# Patient Record
Sex: Female | Born: 1965 | Race: White | Hispanic: No | Marital: Married | Smoking: Never smoker
Health system: Southern US, Community
[De-identification: ages and names within clinical notes are randomized; demographics above are authoritative.]

## PROBLEM LIST (undated history)

## (undated) DIAGNOSIS — J45909 Unspecified asthma, uncomplicated: Secondary | ICD-10-CM

## (undated) HISTORY — PX: APPENDECTOMY: SHX54

---

## 2012-06-01 ENCOUNTER — Emergency Department
Admission: EM | Admit: 2012-06-01 | Discharge: 2012-06-01 | Disposition: A | Discharge status: Home / Self Care | Payer: BC Managed Care – PPO | Source: Home / Self Care

## 2012-06-01 ENCOUNTER — Encounter: Payer: Self-pay | Admitting: Emergency Medicine

## 2012-06-01 MED ORDER — INFLUENZA VAC TYP A&B SURF ANT IM INJ: 0.5000 mL | INJECTION | INTRAMUSCULAR | Status: DC

## 2012-06-01 NOTE — ED Notes (Signed)
Flu shot

## 2012-06-01 NOTE — ED Notes (Signed)
Patient's son tested positive for Flu A, so Flu shot was discontinued for now, will come back in a couple of weeks.

## 2012-07-31 ENCOUNTER — Encounter: Payer: Self-pay | Admitting: *Deleted

## 2012-07-31 ENCOUNTER — Emergency Department
Admission: EM | Admit: 2012-07-31 | Discharge: 2012-07-31 | Disposition: A | Discharge status: Home / Self Care | Payer: BC Managed Care – PPO | Source: Home / Self Care | Attending: Family Medicine | Admitting: Family Medicine

## 2012-07-31 DIAGNOSIS — B309 Viral conjunctivitis, unspecified: Secondary | ICD-10-CM

## 2012-07-31 MED ORDER — SULFACETAMIDE SODIUM 10 % OP SOLN: 1.0000 [drp] | OPHTHALMIC | Status: DC

## 2012-07-31 NOTE — ED Notes (Signed)
Pt c/o RT eye soreness, watering and redness x 1 day. Denies injury.

## 2012-07-31 NOTE — ED Provider Notes (Signed)
History     CSN: [id number]  Arrival date & time 07/31/12  1737   First MD Initiated Contact with Patient 07/31/12 1749      Chief Complaint  Patient presents with  . Eye Drainage  . Eye Problem       HPI Comments: Patient noticed mild redness in her right eye last night but no foreign body sensation.  The redness was somewhat worse this morning upon awakening.  She has had increased lacrimation in her right eye but no discharge.  No known foreign body.  No symptoms in left eye.  She notes mild nasal congestion but no more than usual.  She has had an occasional mild cough for a week.  She feels well otherwise.  No sore throat.  Patient is a 47 y.o. female presenting with conjunctivitis. The history is provided by the patient.  Conjunctivitis  The current episode started yesterday. The onset was sudden. The problem occurs continuously. The problem has been unchanged. The problem is mild. Nothing relieves the symptoms. Nothing aggravates the symptoms. Associated symptoms include congestion, eye pain and eye redness. Pertinent negatives include no fever, no decreased vision, no double vision, no eye itching, no photophobia, no ear discharge, no ear pain, no headaches, no hearing loss, no mouth sores, no rhinorrhea, no sore throat, no swollen glands, no neck pain, no neck stiffness, no cough, no URI and no eye discharge. The eye pain is mild. The right eye is affected.The eye pain is not associated with movement.    History reviewed. No pertinent past medical history.  Past Surgical History  Procedure Laterality Date  . Appendectomy      Family History  Problem Relation Age of Onset  . Adopted: Yes    History  Substance Use Topics  . Smoking status: Never Smoker   . Smokeless tobacco: Not on file  . Alcohol Use: No    OB History   Grav Para Term Preterm Abortions TAB SAB Ect Mult Living                  Review of Systems  Constitutional: Negative for fever.  HENT:  Positive for congestion. Negative for hearing loss, ear pain, sore throat, rhinorrhea, mouth sores, neck pain and ear discharge.   Eyes: Positive for pain and redness. Negative for double vision, photophobia, discharge and itching.  Respiratory: Negative for cough.   Neurological: Negative for headaches.  All other systems reviewed and are negative.    Allergies  Review of patient's allergies indicates no known allergies.  Home Medications   Current Outpatient Rx  Name  Route  Sig  Dispense  Refill  . sulfacetamide (BLEPH-10) 10 % ophthalmic solution   Right Eye   Place 1-2 drops into the right eye every 3 (three) hours. Use while awake   5 mL   0     BP 137/83  Pulse 87  Temp(Src) 97.7 F (36.5 C) (Oral)  Ht 5\' 10"  (1.778 m)  Wt 268 lb (121.564 kg)  BMI 38.45 kg/m2  SpO2 97%  Physical Exam Nursing notes and Vital Signs reviewed. Appearance:  Patient appears stated age, and in no acute distress.  Patient is obese (BMI 38.5) Eyes:  Pupils are equal, round, and reactive to light and accomodation.  Extraocular movement is intact.  Right conjunctivae minimally injected.  No discharge.  No lid swelling or tenderness.  Fundi normal.  Right lid eversion reveals no foreign body.  Fluorescein reveals no uptake. Ears:  Canals normal.  Tympanic membranes normal.  Nose:  Mildly congested turbinates, worse on right.  No sinus tenderness.   Pharynx:  Normal Neck:  Supple.  No adenopathy Skin:  No rash present.   ED Course  Procedures  none      1. Viral conjunctivitis; suspect early viral URI       MDM   Recommend using refrigerated lubricating drops (such as Refresh tears).  Begin antibiotic eye drops if not improving in about two days (given Rx for sulfacetamide 10% ophth soln).  Followup with ophthalmologist if not improved about 4 days.        Lattie Haw, MD 07/31/12 [phone]

## 2013-01-16 ENCOUNTER — Emergency Department (INDEPENDENT_AMBULATORY_CARE_PROVIDER_SITE_OTHER): Payer: BC Managed Care – PPO

## 2013-01-16 ENCOUNTER — Encounter: Payer: Self-pay | Admitting: *Deleted

## 2013-01-16 ENCOUNTER — Emergency Department
Admission: EM | Admit: 2013-01-16 | Discharge: 2013-01-16 | Disposition: A | Discharge status: Home / Self Care | Payer: BC Managed Care – PPO | Source: Home / Self Care | Attending: Family Medicine | Admitting: Family Medicine

## 2013-01-16 DIAGNOSIS — M25569 Pain in unspecified knee: Secondary | ICD-10-CM

## 2013-01-16 DIAGNOSIS — M545 Low back pain, unspecified: Secondary | ICD-10-CM

## 2013-01-16 DIAGNOSIS — IMO0002 Reserved for concepts with insufficient information to code with codable children: Secondary | ICD-10-CM

## 2013-01-16 DIAGNOSIS — M2560 Stiffness of unspecified joint, not elsewhere classified: Secondary | ICD-10-CM

## 2013-01-16 DIAGNOSIS — M25559 Pain in unspecified hip: Secondary | ICD-10-CM

## 2013-01-16 DIAGNOSIS — M25551 Pain in right hip: Secondary | ICD-10-CM

## 2013-01-16 DIAGNOSIS — S93409A Sprain of unspecified ligament of unspecified ankle, initial encounter: Secondary | ICD-10-CM

## 2013-01-16 DIAGNOSIS — S8391XA Sprain of unspecified site of right knee, initial encounter: Secondary | ICD-10-CM

## 2013-01-16 DIAGNOSIS — M25579 Pain in unspecified ankle and joints of unspecified foot: Secondary | ICD-10-CM

## 2013-01-16 MED ORDER — MELOXICAM 15 MG PO TABS: 15.0000 mg | ORAL_TABLET | Freq: Every day | ORAL | Status: DC

## 2013-01-16 MED ORDER — CYCLOBENZAPRINE HCL 10 MG PO TABS: 10.0000 mg | ORAL_TABLET | Freq: Three times a day (TID) | ORAL | Status: DC | PRN

## 2013-01-16 MED ORDER — KETOROLAC TROMETHAMINE 30 MG/ML IJ SOLN
30.0000 mg | Freq: Once | INTRAMUSCULAR | Status: AC
  Administered 2013-01-16: 30 mg via INTRAMUSCULAR
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
## 2013-01-26 ENCOUNTER — Telehealth: Payer: Self-pay | Admitting: *Deleted

## 2013-01-30 ENCOUNTER — Other Ambulatory Visit: Payer: Self-pay | Admitting: Family Medicine

## 2013-03-14 ENCOUNTER — Other Ambulatory Visit: Payer: Self-pay | Admitting: Family Medicine

## 2013-03-15 ENCOUNTER — Encounter: Payer: Self-pay | Admitting: Emergency Medicine

## 2013-03-15 ENCOUNTER — Emergency Department
Admission: EM | Admit: 2013-03-15 | Discharge: 2013-03-15 | Disposition: A | Discharge status: Home / Self Care | Payer: BC Managed Care – PPO | Source: Home / Self Care | Attending: Emergency Medicine | Admitting: Emergency Medicine

## 2013-03-15 DIAGNOSIS — J209 Acute bronchitis, unspecified: Secondary | ICD-10-CM

## 2013-03-15 MED ORDER — PROMETHAZINE-CODEINE 6.25-10 MG/5ML PO SYRP: ORAL_SOLUTION | ORAL | Status: DC

## 2013-03-15 MED ORDER — AZITHROMYCIN 250 MG PO TABS: ORAL_TABLET | ORAL | Status: DC

## 2013-03-15 NOTE — ED Provider Notes (Addendum)
CSN: [id number]     Arrival date & time 03/15/13  4098 History   First MD Initiated Contact with Patient 03/15/13 [phone]     Chief Complaint  Patient presents with  . Cough   (Consider location/radiation/quality/duration/timing/severity/associated sxs/prior Treatment) The history is provided by the patient.  Dry cough, body aches, congestion, runny nose x 6 days URI HISTORY  Regina Rivas is a 47 y.o. female who complains of onset of cold symptoms for 6 days.  Have been using over-the-counter treatment which helps a little bit.  No chills/sweats +  Low-grade Fever  +  Nasal congestion +  Minimal Discolored Post-nasal drainage Mild sinus pain/pressure No sore throat  +  Cough, severe, hacking at times, occasionally inducing vomiting after a coughing fit. No blood in vomit Occasional scant discolored sputum. No wheezing Positive chest congestion No hemoptysis No shortness of breath No pleuritic pain  No itchy/red eyes No earache  No nausea No vomiting No abdominal pain No diarrhea  No skin rashes +  Fatigue No myalgias Minimal headache   No history of chronic lung disease. Nonsmoker. No recent travel. had her flu shot last month  Denies chance of pregnancy  History reviewed. No pertinent past medical history. Past Surgical History  Procedure Laterality Date  . Appendectomy     Family History  Problem Relation Age of Onset  . Adopted: Yes   History  Substance Use Topics  . Smoking status: Never Smoker   . Smokeless tobacco: Not on file  . Alcohol Use: No   OB History   Grav Para Term Preterm Abortions TAB SAB Ect Mult Living                 Review of Systems  All other systems reviewed and are negative.    Allergies  Review of patient's allergies indicates not on file.  Home Medications   Current Outpatient Rx  Name  Route  Sig  Dispense  Refill  . DM-Doxylamine-Acetaminophen (NYQUIL COLD & FLU PO)   Oral   Take by mouth.         .  Pseudoephedrine-APAP-DM (DAYQUIL MULTI-SYMPTOM COLD/FLU PO)   Oral   Take by mouth.         Marland Kitchen azithromycin (ZITHROMAX Z-PAK) 250 MG tablet      Take 2 tablets on day one, then 1 tablet daily on days 2 through 5   1 each   0   . promethazine-codeine (PHENERGAN WITH CODEINE) 6.25-10 MG/5ML syrup      Take 1-2 teaspoons every 4-6 hours as needed for cough. May cause drowsiness.   120 mL   0    BP 156/91  Pulse 94  Temp(Src) 97.7 F (36.5 C) (Oral)  Ht 5\' 9"  (1.753 m)  Wt 261 lb (118.389 kg)  BMI 38.53 kg/m2  SpO2 98% Physical Exam  Nursing note and vitals reviewed. Constitutional: She is oriented to person, place, and time. She appears well-developed and well-nourished. No distress.  HENT:  Head: Normocephalic and atraumatic.  Right Ear: Tympanic membrane normal.  Left Ear: Tympanic membrane normal.  Nose: Nose normal.  Mouth/Throat: Oropharynx is clear and moist. No oropharyngeal exudate.  Eyes: Right eye exhibits no discharge. Left eye exhibits no discharge. No scleral icterus.  Neck: Neck supple.  Cardiovascular: Normal rate, regular rhythm and normal heart sounds.   Pulmonary/Chest: No respiratory distress. She has no wheezes. She has rhonchi. She has no rales.  Lymphadenopathy:    She has no cervical adenopathy.  Neurological: She is alert and oriented to person, place, and time.  Skin: Skin is warm and dry.    ED Course  Procedures (including critical care time) Labs Review Labs Reviewed - No data to display Imaging Review No results found.  EKG Interpretation     Ventricular Rate:    PR Interval:    QRS Duration:   QT Interval:    QTC Calculation:   R Axis:     Text Interpretation:              MDM   1. Acute bronchitis    Treatment options discussed. Zithromax Z-Pak. Promethazine with codeine cough syrup, 4 ounces. No refills. Instructions and precautions discussed. Other OTC symptomatic care discussed. Precautions discussed. Red  flags discussed. Questions invited and answered. Patient voiced understanding and agreement.    Lajean Manes, MD 03/15/13 1610  Lajean Manes, MD 03/15/13 [phone]

## 2013-03-15 NOTE — ED Notes (Signed)
Dry cough, body aches, congestion, runny nose x 6 days

## 2013-05-28 ENCOUNTER — Other Ambulatory Visit: Payer: Self-pay | Admitting: Family Medicine

## 2014-07-06 ENCOUNTER — Encounter: Payer: Self-pay | Admitting: Emergency Medicine

## 2014-07-06 ENCOUNTER — Emergency Department (INDEPENDENT_AMBULATORY_CARE_PROVIDER_SITE_OTHER)
Admission: EM | Admit: 2014-07-06 | Discharge: 2014-07-06 | Disposition: A | Discharge status: Home / Self Care | Payer: BLUE CROSS/BLUE SHIELD | Source: Home / Self Care | Attending: Family Medicine | Admitting: Family Medicine

## 2014-07-06 DIAGNOSIS — M722 Plantar fascial fibromatosis: Secondary | ICD-10-CM

## 2014-07-06 HISTORY — DX: Unspecified asthma, uncomplicated: J45.909

## 2014-07-06 NOTE — Discharge Instructions (Signed)
Thank you for coming in today. A deep ice for 10-15 minutes several times a day Do the deep ice massage if possible remember to pull your big toe back.  do the heel lift exercises. Remember to lower your self slowly Follow up with Dr. Karie Schwalbe   Plantar Fasciitis (Heel Spur Syndrome) with Rehab The plantar fascia is a fibrous, ligament-like, soft-tissue structure that spans the bottom of the foot. Plantar fasciitis is a condition that causes pain in the foot due to inflammation of the tissue. SYMPTOMS   Pain and tenderness on the underneath side of the foot.  Pain that worsens with standing or walking. CAUSES  Plantar fasciitis is caused by irritation and injury to the plantar fascia on the underneath side of the foot. Common mechanisms of injury include:  Direct trauma to bottom of the foot.  Damage to a small nerve that runs under the foot where the main fascia attaches to the heel bone.  Stress placed on the plantar fascia due to bone spurs. RISK INCREASES WITH:   Activities that place stress on the plantar fascia (running, jumping, pivoting, or cutting).  Poor strength and flexibility.  Improperly fitted shoes.  Tight calf muscles.  Flat feet.  Failure to warm-up properly before activity.  Obesity. PREVENTION  Warm up and stretch properly before activity.  Allow for adequate recovery between workouts.  Maintain physical fitness:  Strength, flexibility, and endurance.  Cardiovascular fitness.  Maintain a health body weight.  Avoid stress on the plantar fascia.  Wear properly fitted shoes, including arch supports for individuals who have flat feet. PROGNOSIS  If treated properly, then the symptoms of plantar fasciitis usually resolve without surgery. However, occasionally surgery is necessary. RELATED COMPLICATIONS   Recurrent symptoms that may result in a chronic condition.  Problems of the lower back that are caused by compensating for the injury, such as  limping.  Pain or weakness of the foot during push-off following surgery.  Chronic inflammation, scarring, and partial or complete fascia tear, occurring more often from repeated injections. TREATMENT  Treatment initially involves the use of ice and medication to help reduce pain and inflammation. The use of strengthening and stretching exercises may help reduce pain with activity, especially stretches of the Achilles tendon. These exercises may be performed at home or with a therapist. Your caregiver may recommend that you use heel cups of arch supports to help reduce stress on the plantar fascia. Occasionally, corticosteroid injections are given to reduce inflammation. If symptoms persist for greater than 6 months despite non-surgical (conservative), then surgery may be recommended.  MEDICATION   If pain medication is necessary, then nonsteroidal anti-inflammatory medications, such as aspirin and ibuprofen, or other minor pain relievers, such as acetaminophen, are often recommended.  Do not take pain medication within 7 days before surgery.  Prescription pain relievers may be given if deemed necessary by your caregiver. Use only as directed and only as much as you need.  Corticosteroid injections may be given by your caregiver. These injections should be reserved for the most serious cases, because they may only be given a certain number of times. HEAT AND COLD  Cold treatment (icing) relieves pain and reduces inflammation. Cold treatment should be applied for 10 to 15 minutes every 2 to 3 hours for inflammation and pain and immediately after any activity that aggravates your symptoms. Use ice packs or massage the area with a piece of ice (ice massage).  Heat treatment may be used prior to performing the  stretching and strengthening activities prescribed by your caregiver, physical therapist, or athletic trainer. Use a heat pack or soak the injury in warm water. SEEK IMMEDIATE MEDICAL CARE  IF:  Treatment seems to offer no benefit, or the condition worsens.  Any medications produce adverse side effects. EXERCISES RANGE OF MOTION (ROM) AND STRETCHING EXERCISES - Plantar Fasciitis (Heel Spur Syndrome) These exercises may help you when beginning to rehabilitate your injury. Your symptoms may resolve with or without further involvement from your physician, physical therapist or athletic trainer. While completing these exercises, remember:   Restoring tissue flexibility helps normal motion to return to the joints. This allows healthier, less painful movement and activity.  An effective stretch should be held for at least 30 seconds.  A stretch should never be painful. You should only feel a gentle lengthening or release in the stretched tissue. RANGE OF MOTION - Toe Extension, Flexion  Sit with your right / left leg crossed over your opposite knee.  Grasp your toes and gently pull them back toward the top of your foot. You should feel a stretch on the bottom of your toes and/or foot.  Hold this stretch for __________ seconds.  Now, gently pull your toes toward the bottom of your foot. You should feel a stretch on the top of your toes and or foot.  Hold this stretch for __________ seconds. Repeat __________ times. Complete this stretch __________ times per day.  RANGE OF MOTION - Ankle Dorsiflexion, Active Assisted  Remove shoes and sit on a chair that is preferably not on a carpeted surface.  Place right / left foot under knee. Extend your opposite leg for support.  Keeping your heel down, slide your right / left foot back toward the chair until you feel a stretch at your ankle or calf. If you do not feel a stretch, slide your bottom forward to the edge of the chair, while still keeping your heel down.  Hold this stretch for __________ seconds. Repeat __________ times. Complete this stretch __________ times per day.  STRETCH - Gastroc, Standing  Place hands on  wall.  Extend right / left leg, keeping the front knee somewhat bent.  Slightly point your toes inward on your back foot.  Keeping your right / left heel on the floor and your knee straight, shift your weight toward the wall, not allowing your back to arch.  You should feel a gentle stretch in the right / left calf. Hold this position for __________ seconds. Repeat __________ times. Complete this stretch __________ times per day. STRETCH - Soleus, Standing  Place hands on wall.  Extend right / left leg, keeping the other knee somewhat bent.  Slightly point your toes inward on your back foot.  Keep your right / left heel on the floor, bend your back knee, and slightly shift your weight over the back leg so that you feel a gentle stretch deep in your back calf.  Hold this position for __________ seconds. Repeat __________ times. Complete this stretch __________ times per day. STRETCH - Gastrocsoleus, Standing  Note: This exercise can place a lot of stress on your foot and ankle. Please complete this exercise only if specifically instructed by your caregiver.   Place the ball of your right / left foot on a step, keeping your other foot firmly on the same step.  Hold on to the wall or a rail for balance.  Slowly lift your other foot, allowing your body weight to press your heel down  over the edge of the step.  You should feel a stretch in your right / left calf.  Hold this position for __________ seconds.  Repeat this exercise with a slight bend in your right / left knee. Repeat __________ times. Complete this stretch __________ times per day.  STRENGTHENING EXERCISES - Plantar Fasciitis (Heel Spur Syndrome)  These exercises may help you when beginning to rehabilitate your injury. They may resolve your symptoms with or without further involvement from your physician, physical therapist or athletic trainer. While completing these exercises, remember:   Muscles can gain both the  endurance and the strength needed for everyday activities through controlled exercises.  Complete these exercises as instructed by your physician, physical therapist or athletic trainer. Progress the resistance and repetitions only as guided. STRENGTH - Towel Curls  Sit in a chair positioned on a non-carpeted surface.  Place your foot on a towel, keeping your heel on the floor.  Pull the towel toward your heel by only curling your toes. Keep your heel on the floor.  If instructed by your physician, physical therapist or athletic trainer, add ____________________ at the end of the towel. Repeat __________ times. Complete this exercise __________ times per day. STRENGTH - Ankle Inversion  Secure one end of a rubber exercise band/tubing to a fixed object (table, pole). Loop the other end around your foot just before your toes.  Place your fists between your knees. This will focus your strengthening at your ankle.  Slowly, pull your big toe up and in, making sure the band/tubing is positioned to resist the entire motion.  Hold this position for __________ seconds.  Have your muscles resist the band/tubing as it slowly pulls your foot back to the starting position. Repeat __________ times. Complete this exercises __________ times per day.  Document Released: 04/19/2005 Document Revised: 07/12/2011 Document Reviewed: 08/01/2008 [facility] CenterExitCare Patient Information 2015 [employer], [place]. This information is not intended to replace advice given to you by your health care provider. Make sure you discuss any questions you have with your health care provider.

## 2014-07-06 NOTE — ED Notes (Signed)
Patient reports about 6 weeks of heel and arch pain predominantly in right foot, but some in left foot also. Worse in certain positions.

## 2014-07-06 NOTE — ED Provider Notes (Signed)
Regina Rivas is a 49 y.o. female who presents to Urgent Care today for heel pain. Patient has a 6 week history of plantar calcaneus pain. The pain is stabbing and worse with ambulation. She notes pain when she first stands up in the morning. She's tried some over-the-counter foot insoles which seemed to help a bit. She has not had any dedicated exercises. She is concerned that she may have plantar fasciitis or a heel spur. She denies any injury. No radiating pain fevers chills nausea vomiting or diarrhea. She's tried some Aleve which helps. 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   Past Medical History  Diagnosis Date  . Asthma    Past Surgical History  Procedure Laterality Date  . Appendectomy     History  Substance Use Topics  . Smoking status: Never Smoker   . Smokeless tobacco: Not on file  . Alcohol Use: No   ROS as above Medications: No current facility-administered medications for this encounter.   Current Outpatient Prescriptions  Medication Sig Dispense Refill  . meloxicam (MOBIC) 15 MG tablet TAKE 1 TABLET (15 MG TOTAL) BY MOUTH DAILY. 30 tablet 1   No Known Allergies   Exam:  BP 121/82 mmHg  Pulse 100  Temp(Src) 97.5 F (36.4 C) (Oral)  Ht 5\' 9"  (1.753 m)  Wt 280 lb (127.007 kg)  BMI 41.33 kg/m2  SpO2 96% Gen: Well NAD Right foot normal-appearing no significant swelling or obvious deformity. Tender palpation at the plantar medial calcaneus. Pain with first toe dorsiflexion. Normal motion pulses capillary refill and sensation.  No results found for this or any previous visit (from the past 24 hour(s)). No results found.  Assessment and Plan: 49 y.o. female with plantar fasciitis. Plan for heel cups, eccentric calf exercises, ice and massage, and follow up with sports medicine.  Discussed warning signs or symptoms. Please see discharge instructions. Patient expresses understanding.     Rodolph BongEvan S Naryah Clenney, MD 07/06/14 [phone] [id number]

## 2016-08-31 DIAGNOSIS — R6889 Other general symptoms and signs: Secondary | ICD-10-CM | POA: Diagnosis not present

## 2016-08-31 DIAGNOSIS — J029 Acute pharyngitis, unspecified: Secondary | ICD-10-CM | POA: Diagnosis not present

## 2017-09-12 ENCOUNTER — Emergency Department (INDEPENDENT_AMBULATORY_CARE_PROVIDER_SITE_OTHER): Payer: BLUE CROSS/BLUE SHIELD

## 2017-09-12 ENCOUNTER — Other Ambulatory Visit: Payer: Self-pay

## 2017-09-12 ENCOUNTER — Emergency Department
Admission: EM | Admit: 2017-09-12 | Discharge: 2017-09-12 | Disposition: A | Discharge status: Home / Self Care | Payer: BLUE CROSS/BLUE SHIELD | Source: Home / Self Care | Attending: Emergency Medicine | Admitting: Emergency Medicine

## 2017-09-12 DIAGNOSIS — M79642 Pain in left hand: Secondary | ICD-10-CM | POA: Diagnosis not present

## 2017-09-12 DIAGNOSIS — S8262XA Displaced fracture of lateral malleolus of left fibula, initial encounter for closed fracture: Secondary | ICD-10-CM | POA: Diagnosis not present

## 2017-09-12 DIAGNOSIS — S82892A Other fracture of left lower leg, initial encounter for closed fracture: Secondary | ICD-10-CM | POA: Diagnosis not present

## 2017-09-12 DIAGNOSIS — W19XXXA Unspecified fall, initial encounter: Secondary | ICD-10-CM

## 2017-09-12 DIAGNOSIS — S60222A Contusion of left hand, initial encounter: Secondary | ICD-10-CM

## 2017-09-12 MED ORDER — IBUPROFEN 200 MG PO TABS: ORAL_TABLET | ORAL | 0 refills | Status: AC

## 2017-09-12 NOTE — ED Provider Notes (Signed)
[facility]    CSN: [id number] Arrival date & time: 09/12/17  1113     History   Chief Complaint Chief Complaint  Patient presents with  . Ankle Pain    Left  . Hand Pain    Left- Thumb pain also    HPI Regina Rivas is a 52 y.o. female.   HPI Yesterday, accidentally fell.  She states she "rolled my left ankle" heard a pop, and fell landing on the left hand and thumb. Currently, has mild diffuse dull pain left lateral ankle associated with mild numbness and moderate to severe swelling lateral left ankle.  She can weight-bear, but with much pain.  She reports she had fractured that left ankle many years ago. When she fell yesterday, landed on base of left thumb area of left hand and it is persistently moderately swollen and mildly painful.  No numbness.  She feels she cannot grip well with left thumb. Tried Tylenol at 9 AM this morning and that helped her pain a little.  Used ice last night and that helped a little. Past Medical History:  Diagnosis Date  . Asthma   Past medical history of asthma, not having wheezing now.  She has on amoxicillin, prescribed by another physician for URI, and that is helping her URI.  There are no active problems to display for this patient.   Past Surgical History:  Procedure Laterality Date  . APPENDECTOMY      OB History   None      Home Medications    Prior to Admission medications   Medication Sig Start Date End Date Taking? Authorizing Provider  ibuprofen (ADVIL,MOTRIN) 200 MG tablet Take three tablets ( 600 milligrams total) every 8 hrs with food as needed for pain. 09/12/17   Lajean Manes, MD    Family History Family History  Adopted: Yes  Family history unknown, she is adopted.  Social History Social History   Tobacco Use  . Smoking status: Never Smoker  . Smokeless tobacco: Never Used  Substance Use Topics  . Alcohol use: No  . Drug use: No   She has never smoked.  Allergies   Patient has no known  allergies.   Review of Systems Review of Systems  Constitutional: Negative for fever.  HENT:       URI diagnosed and treated elsewhere several days ago, which is improving on amoxicillin prescribed  Respiratory: Positive for cough (Minimal, nonproductive). Negative for shortness of breath.   Cardiovascular: Negative for chest pain.  Gastrointestinal: Negative for abdominal pain, diarrhea, nausea and vomiting.  Genitourinary: Negative.   Musculoskeletal: Positive for joint swelling. Negative for neck pain.       See also HPI  Neurological: Negative for dizziness and light-headedness.  Psychiatric/Behavioral: Negative for confusion.  All other systems reviewed and are negative.    Physical Exam Triage Vital Signs ED Triage Vitals  Enc Vitals Group     BP 09/12/17 1145 134/83     Pulse Rate 09/12/17 1145 90     Resp --      Temp 09/12/17 1145 98.2 F (36.8 C)     Temp Source 09/12/17 1145 Oral     SpO2 09/12/17 1145 96 %     Weight 09/12/17 1156 (!) 321 lb 12.8 oz (146 kg)     Height 09/12/17 1156  (1.753 m)     Head Circumference --      Peak Flow --      Pain Score  09/12/17 1146 0     Pain Loc --      Pain Edu? --      Excl. in GC? --    No data found.  Updated Vital Signs BP 134/83 (BP Location: Right Arm)   Pulse 90   Temp 98.2 F (36.8 C) (Oral)   Ht  (1.753 m)   Wt (!) 321 lb 12.8 oz (146 kg)   SpO2 96%   BMI 47.52 kg/m    Physical Exam  Constitutional: She is oriented to person, place, and time. She appears well-developed and well-nourished. No distress.  HENT:  Head: Normocephalic and atraumatic.  Eyes: Pupils are equal, round, and reactive to light. No scleral icterus.  Neck: Normal range of motion. Neck supple.  Cardiovascular: Normal rate and regular rhythm.  Pulmonary/Chest: Effort normal.  Abdominal: She exhibits no distension.  Musculoskeletal:       Left hand: She exhibits decreased range of motion and tenderness. She exhibits  normal capillary refill. Normal sensation noted. Normal strength noted.  Left ankle: Very swollen, mildly tender, mildly ecchymotic, lateral aspect left ankle over lateral malleolus, also proximal aspect of lateral malleolus and over anterior talofibular ligament.  Pain exacerbated by inversion.  Mildly decreased range of motion.  No definite instability. Sensation, motor, tendons intact.  Capillary refill distally intact and DP pulse intact.  Left hand: Very tender and swollen over base of left thumb.  Decreased range of motion.  No open wound.  No instability. Neurovascular distally intact    Neurological: She is alert and oriented to person, place, and time. No cranial nerve deficit.  Skin: Skin is warm and dry.  Psychiatric: She has a normal mood and affect. Her behavior is normal.  Vitals reviewed.  12:01 PM-x-rays left ankle and left hand ordered  UC Treatments / Results  Labs (all labs ordered are listed, but only abnormal results are displayed) Labs Reviewed - No data to display  EKG None  Radiology Dg Ankle Complete Left  Result Date: 09/12/2017 CLINICAL DATA:  Status post fall.  Twisting injury. EXAM: LEFT ANKLE COMPLETE - 3+ VIEW COMPARISON:  None FINDINGS: Avulsion fracture of distal tip of the lateral malleolus with severe overlying soft tissue swelling. Multiple well corticated bony fragments adjacent to the medial malleolus consistent with prior avulsive injury. No other acute fracture or dislocation. Ankle mortise is intact. IMPRESSION: Avulsion fracture of distal tip of the lateral malleolus with severe overlying soft tissue swelling. Electronically Signed   By: Elige Ko   On: 09/12/2017 12:36   Dg Hand Complete Left  Result Date: 09/12/2017 CLINICAL DATA:  Status post fall.  Left hand pain. EXAM: LEFT HAND - COMPLETE 3+ VIEW COMPARISON:  None. FINDINGS: There is no evidence of fracture or dislocation. There is no evidence of arthropathy or other focal bone  abnormality. Soft tissues are unremarkable. IMPRESSION: No acute osseous injury of the left hand. Electronically Signed   By: Elige Ko   On: 09/12/2017 12:37    Procedures Procedures (including critical care time)  Medications Ordered in UC Medications - No data to display  Initial Impression / Assessment and Plan / UC Course  I have reviewed the triage vital signs and the nursing notes.  Pertinent labs & imaging results that were available during my care of the patient were reviewed by me and considered in my medical decision making (see chart for details).      Final Clinical Impressions(s) / UC Diagnoses   Final  diagnoses:  Closed fracture of left ankle, initial encounter  Contusion of left hand, initial encounter  Reviewed with her that x-ray left hand normal.  X-ray left ankle shows a tiny avulsion fracture at the tip of lateral malleolus. Treatment options discussed, as well as risks, benefits, alternatives. Patient voiced understanding and agreement with the following plans: She declined any splint or wrap for left hand/thumb, but precautions discussed and other options discussed. Encourage rest, ice, compression with ACE bandage, and elevation of injured body part. Ace bandage left ankle.  Cam walker/boot left ankle. An After Visit Summary was printed and given to the patient. See below for details regarding instructions given in AVS. Verbal instructions also given. She declined any prescription pain med, but discussed OTC ibuprofen up to 600 mg 3 times daily with food as needed pain.  She has taken ibuprofen in the past without problems.  Advised not to take ibuprofen with any other NSAIDs.  She denies history of kidney problems or abnormal kidney function tests in the past.-I advised to drink plenty of fluids when she is taking any NSAID. Follow-up with sports medicine or orthopedist in 7 days Precautions discussed. Red flags discussed. Questions invited and  answered. Patient voiced understanding and agreement.   Discharge Instructions     X-ray left hand normal.  You have contusion of left hand.  May use Ace bandage or left hand/wrist splint.  This should heal with time. X-ray left ankle shows a tiny chip fracture over the tip of the lateral malleolus/end of left fibula (see printed photo given). Ice and Ace bandage and elevate for the next 2 days.  "Cam boot" provided.  Try to keep left ankle elevated for much of day, but also important to get up and walk around every couple of hours during the day.  For pain, OTC ibuprofen up to 600 mg every 8 hours with food as needed for pain. (Do not take ibuprofen if history of any kidney problems, and do not take other NSAIDs with ibuprofen) Call and make an appointment to follow-up with Dr. Clementeen Graham, sports medicine specialist, in 1 week. If any severe worsening symptoms in the meantime, follow-up here in urgent care or go to emergency room. See also attached instruction sheets on tibia fracture and hand contusion    ED Prescriptions    Medication Sig Dispense Auth. Provider   ibuprofen (ADVIL,MOTRIN) 200 MG tablet Take three tablets ( 600 milligrams total) every 8 hrs with food as needed for pain. 20 tablet Lajean Manes, MD     Controlled Substance Prescriptions [place] Controlled Substance Registry consulted? Not Applicable   Lajean Manes, MD 09/12/17 1538

## 2017-09-12 NOTE — ED Triage Notes (Signed)
Pt fell at [facility] yesterday and rolled her left ankle. Also has some pain in her L hand/thumb. Hard to grip. Used Ice and Tylenol. Last dose 9am this morning.

## 2017-09-12 NOTE — Discharge Instructions (Signed)
X-ray left hand normal.  You have contusion of left hand.  May use Ace bandage or left hand/wrist splint.  This should heal with time. X-ray left ankle shows a tiny chip fracture over the tip of the lateral malleolus/end of left fibula (see printed photo given). Ice and Ace bandage and elevate for the next 2 days.  "Cam boot" provided.  Try to keep left ankle elevated for much of day, but also important to get up and walk around every couple of hours during the day.  For pain, OTC ibuprofen up to 600 mg every 8 hours with food as needed for pain. (Do not take ibuprofen if history of any kidney problems, and do not take other NSAIDs with ibuprofen) Call and make an appointment to follow-up with Dr. Clementeen Graham, sports medicine specialist, in 1 week. If any severe worsening symptoms in the meantime, follow-up here in urgent care or go to emergency room. See also attached instruction sheets on tibia fracture and hand contusion

## 2017-09-22 ENCOUNTER — Ambulatory Visit: Payer: BLUE CROSS/BLUE SHIELD | Admitting: Family Medicine

## 2017-09-22 ENCOUNTER — Encounter: Payer: Self-pay | Admitting: Family Medicine

## 2017-09-22 VITALS — BP 157/97 | HR 93 | Ht 70.0 in | Wt 320.0 lb

## 2017-09-22 DIAGNOSIS — IMO0001 Reserved for inherently not codable concepts without codable children: Secondary | ICD-10-CM

## 2017-09-22 DIAGNOSIS — S82892A Other fracture of left lower leg, initial encounter for closed fracture: Secondary | ICD-10-CM

## 2017-09-22 DIAGNOSIS — S93402A Sprain of unspecified ligament of left ankle, initial encounter: Secondary | ICD-10-CM | POA: Diagnosis not present

## 2017-09-22 DIAGNOSIS — M79672 Pain in left foot: Secondary | ICD-10-CM | POA: Diagnosis not present

## 2017-09-22 NOTE — Progress Notes (Signed)
   Subjective:    I'm seeing this patient as a consultation for:  Waylan Rocher PA-C   CC: Left ankle injury  HPI: Gavin Pound suffered an inversion injury to her left ankle on 12.  She was seen in urgent care on the 13th where she was found to have a tiny avulsion fracture at the lateral malleolus.  She was treated with a Cam walker boot and ibuprofen ice rest and elevation and compression.  In the interval she has improved a bit.  She notes she is able to walk with a Cam walker boot and feels pretty well.  She does note bruising along the dorsal aspect of her foot.  She denies any fevers or chills nausea vomiting or diarrhea.  Past medical history, Surgical history, Family history not pertinant except as noted below, Social history, Allergies, and medications have been entered into the medical record, reviewed, and no changes needed.   Review of Systems: No headache, visual changes, nausea, vomiting, diarrhea, constipation, dizziness, abdominal pain, skin rash, fevers, chills, night sweats, weight loss, swollen lymph nodes, body aches, joint swelling, muscle aches, chest pain, shortness of breath, mood changes, visual or auditory hallucinations.   Objective:    Vitals:   09/22/17 1435  BP: (!) 157/97  Pulse: 93   General: Well Developed, well nourished, and in no acute distress.  Neuro/Psych: Alert and oriented x3, extra-ocular muscles intact, able to move all 4 extremities, sensation grossly intact. Skin: Warm and dry, no rashes noted.  Respiratory: Not using accessory muscles, speaking in full sentences, trachea midline.  Cardiovascular: Pulses palpable, no extremity edema. Abdomen: Does not appear distended. MSK:  Left ankle: Slightly swollen at the lateral malleolus.  Bruising present at the dorsal toes and along the lateral calf. Ankle motion is intact. Tender to palpation at the ATFL area. Stable ligamentous exam. Intact strength. Pulses capillary refill and sensation are  intact.   EXAM: LEFT ANKLE COMPLETE - 3+ VIEW  COMPARISON:  None  FINDINGS: Avulsion fracture of distal tip of the lateral malleolus with severe overlying soft tissue swelling.  Multiple well corticated bony fragments adjacent to the medial malleolus consistent with prior avulsive injury. No other acute fracture or dislocation. Ankle mortise is intact.  IMPRESSION: Avulsion fracture of distal tip of the lateral malleolus with severe overlying soft tissue swelling.   Electronically Signed   By: Elige Ko   On: 09/12/2017 12:36 I personally (independently) visualized and performed the interpretation of the images attached in this note.   Impression and Recommendations:    Assessment and Plan: 52 y.o. female with left lateral ankle avulsion fracture/sprain.  Doing quite well.  Plan to transition out of CAM Walker boot to ASO brace.  Additionally proceed with physical therapy and home exercise program.  Continue NSAIDs Tylenol elevation ice and compression.  Recheck.  4 to 6 weeks.   Orders Placed This Encounter  Procedures  . Ambulatory referral to Physical Therapy    Referral Priority:   Routine    Referral Type:   Physical Medicine    Referral Reason:   Specialty Services Required    Requested Specialty:   Physical Therapy   No orders of the defined types were placed in this encounter.   Discussed warning signs or symptoms. Please see discharge instructions. Patient expresses understanding.

## 2017-09-22 NOTE — Patient Instructions (Addendum)
Thank you for coming in today. Wean to the ankle brace.  Do the home exercises.  Attend Physical therapy.  Recheck with me in 4-6 weeks.  If not doing well return sooner perhaps in 2 weeks.    Ankle Sprain, Phase I Rehab Ask your health care provider which exercises are safe for you. Do exercises exactly as told by your health care provider and adjust them as directed. It is normal to feel mild stretching, pulling, tightness, or discomfort as you do these exercises, but you should stop right away if you feel sudden pain or your pain gets worse.Do not begin these exercises until told by your health care provider. Stretching and range of motion exercises These exercises warm up your muscles and joints and improve the movement and flexibility of your lower leg and ankle. These exercises also help to relieve pain and stiffness. Exercise A: Gastroc and soleus stretch  1. Sit on the floor with your left / right leg extended. 2. Loop a belt or towel around the ball of your left / right foot. The ball of your foot is on the walking surface, right under your toes. 3. Keep your left / right ankle and foot relaxed and keep your knee straight while you use the belt or towel to pull your foot toward you. You should feel a gentle stretch behind your calf or knee. 4. Hold this position for __________ seconds, then release to the starting position. Repeat the exercise with your knee bent. You can put a pillow or a rolled bath towel under your knee to support it. You should feel a stretch deep in your calf or at your Achilles tendon. Repeat each stretch __________ times. Complete these stretches __________ times a day. Exercise B: Ankle alphabet  1. Sit with your left / right leg supported at the lower leg. ? Do not rest your foot on anything. ? Make sure your foot has room to move freely. 2. Think of your left / right foot as a paintbrush, and move your foot to trace each letter of the alphabet in the air.  Keep your hip and knee still while you trace. Make the letters as large as you can without feeling discomfort. 3. Trace every letter from A to Z. Repeat __________ times. Complete this exercise __________ times a day. Strengthening exercises These exercises build strength and endurance in your ankle and lower leg. Endurance is the ability to use your muscles for a long time, even after they get tired. Exercise C: Dorsiflexors  1. Secure a rubber exercise band or tube to an object, such as a table leg, that will stay still when the band is pulled. Secure the other end around your left / right foot. 2. Sit on the floor facing the object, with your left / right leg extended. The band or tube should be slightly tense when your foot is relaxed. 3. Slowly bring your foot toward you, pulling the band tighter. 4. Hold this position for __________ seconds. 5. Slowly return your foot to the starting position. Repeat __________ times. Complete this exercise __________ times a day. Exercise D: Plantar flexors  1. Sit on the floor with your left / right leg extended. 2. Loop a rubber exercise tube or band around the ball of your left / right foot. The ball of your foot is on the walking surface, right under your toes. ? Hold the ends of the band or tube in your hands. ? The band or tube should  be slightly tense when your foot is relaxed. 3. Slowly point your foot and toes downward, pushing them away from you. 4. Hold this position for __________ seconds. 5. Slowly return your foot to the starting position. Repeat __________ times. Complete this exercise __________ times a day. Exercise E: Evertors 1. Sit on the floor with your legs straight out in front of you. 2. Loop a rubber exercise band or tube around the ball of your left / right foot. The ball of your foot is on the walking surface, right under your toes. ? Hold the ends of the band in your hands, or secure the band to a stable object. ? The  band or tube should be slightly tense when your foot is relaxed. 3. Slowly push your foot outward, away from your other leg. 4. Hold this position for __________ seconds. 5. Slowly return your foot to the starting position. Repeat __________ times. Complete this exercise __________ times a day. This information is not intended to replace advice given to you by your health care provider. Make sure you discuss any questions you have with your health care provider. Document Released: 11/18/2004 Document Revised: 12/25/2015 Document Reviewed: 03/03/2015 Elsevier Interactive Patient Education  2018 [employer].    Ankle Sprain, Phase II Rehab Ask your health care provider which exercises are safe for you. Do exercises exactly as told by your health care provider and adjust them as directed. It is normal to feel mild stretching, pulling, tightness, or discomfort as you do these exercises, but you should stop right away if you feel sudden pain or your pain gets worse.Do not begin these exercises until told by your health care provider. Stretching and range of motion exercises These exercises warm up your muscles and joints and improve the movement and flexibility of your lower leg and ankle. These exercises also help to relieve pain and stiffness. Exercise A: Gastroc stretch, standing  1. Stand with your hands against a wall. 2. Extend your left / right leg behind you, and bend your front knee slightly. Your heels should be on the floor. 3. Keeping your heels on the floor and your back knee straight, shift your weight toward the wall. You should feel a gentle stretch in the back of your lower leg (calf). 4. Hold this position for __________ seconds. Repeat __________ times. Complete this exercise __________ times a day. Exercise B: Soleus stretch, standing 1. Stand with your hands against a wall. 2. Extend your left / right leg behind you, and bend your front knee slightly. Both of your heels  should be on the floor. 3. Keeping your heels on the floor, bend your back knee and shift your weight slightly over your back leg. You should feel a gentle stretch deep in your calf. 4. Hold this position for __________ seconds. Repeat __________ times. Complete this exercise __________ times a day. Strengthening exercises These exercises build strength and endurance in your lower leg. Endurance is the ability to use your muscles for a long time, even after they get tired. Exercise C: Heel walking ( dorsiflexion) Walk on your heels for __________ seconds or ___________ ft. Keep your toes as high as possible. Repeat __________ times. Complete this exercise __________ times a day. Balance exercises These exercises improve your balance and the reaction and control of your ankle to help improve stability. Exercise D: Multi-angle lunge 1. Stand with your feet together. 2. Take a step forward with your left / right leg, and shift your weight onto  that leg. Your back heel will come off the floor, and your back toes will stay in place. 3. Push off your front leg to return your front foot to the starting position next to your other foot. 4. Repeat to the side, to the back, and any other directions as told by your health care provider. Repeat in each direction __________ times. Complete this exercise __________ times a day. Exercise E: Single leg stand 1. Without shoes, stand near a railing or in a door frame. Hold onto the railing or door frame as needed. 2. Stand on your left / right foot. Keep your big toe down on the floor and try to keep your arch lifted. 3. Hold this position for __________ seconds. Repeat __________ times. Complete this exercise __________ times a day. If this exercise is too easy, you can try it with your eyes closed or while standing on a pillow. Exercise F: Inversion/eversion  You will need a balance board for this exercise. Ask your health care provider where you can get a  balance board or how you can make one. 1. Stand on a non-carpeted surface near a countertop or wall. 2. Step onto the balance board so your feet are hip-width apart. 3. Keep your feet in place and keep your upper body and hips steady. Using only your feet and ankles to move the board, do one or both of the following exercises as told by your health care provider: ? Tip the board side to side as far as you can, alternating between tipping to the left and tipping to the right. If you can, tip the board so it silently taps the floor. Do not let the board forcefully hit the floor. From time to time, pause to hold a steady position. ? Tip the board side to side so the board does not hit the floor at all. From time to time, pause to hold a steady position. Repeat the movement for each exercise __________ times. Complete each exercise __________ times a day. Exercise G: Plantar flexion/dorsiflexion  You will need a balance board for this exercise. Ask your health care provider where you can get a balance board or how you can make one. 1. Stand on a non-carpeted surface near a countertop or wall. 2. Step onto the balance board so your feet are hip-width apart. 3. Keep your feet in place and keep your upper body and hips steady. Using only your feet and ankles to move the board, do one or both of the following exercises as told by your health care provider: ? Tip the board forward and backward so the board silently taps the floor. Do not let the board forcefully hit the floor. From time to time, pause to hold a steady position. ? Tip the board forward and backward so the board does not hit the floor at all. From time to time, pause to hold a steady position. Repeat the movement for each exercise __________ times. Complete each exercise __________ times a day. This information is not intended to replace advice given to you by your health care provider. Make sure you discuss any questions you have with your  health care provider. Document Released: 08/09/2005 Document Revised: 12/25/2015 Document Reviewed: 03/03/2015 Elsevier Interactive Patient Education  2018 [employer].

## 2017-09-23 ENCOUNTER — Encounter: Payer: Self-pay | Admitting: Family Medicine

## 2017-10-27 ENCOUNTER — Ambulatory Visit: Payer: BLUE CROSS/BLUE SHIELD | Admitting: Family Medicine

## 2017-10-27 ENCOUNTER — Ambulatory Visit: Payer: BLUE CROSS/BLUE SHIELD | Admitting: Sports Medicine

## 2019-02-28 DIAGNOSIS — Z20828 Contact with and (suspected) exposure to other viral communicable diseases: Secondary | ICD-10-CM | POA: Diagnosis not present

## 2019-03-09 DIAGNOSIS — U071 COVID-19: Secondary | ICD-10-CM | POA: Diagnosis not present

## 2019-03-09 DIAGNOSIS — B09 Unspecified viral infection characterized by skin and mucous membrane lesions: Secondary | ICD-10-CM | POA: Diagnosis not present

## 2019-04-04 ENCOUNTER — Encounter: Payer: Self-pay | Admitting: Emergency Medicine

## 2019-04-04 ENCOUNTER — Emergency Department
Admission: EM | Admit: 2019-04-04 | Discharge: 2019-04-04 | Disposition: A | Discharge status: Home / Self Care | Payer: BC Managed Care – PPO | Source: Home / Self Care

## 2019-04-04 ENCOUNTER — Other Ambulatory Visit: Payer: Self-pay

## 2019-04-04 DIAGNOSIS — R059 Cough, unspecified: Secondary | ICD-10-CM

## 2019-04-04 DIAGNOSIS — H1031 Unspecified acute conjunctivitis, right eye: Secondary | ICD-10-CM

## 2019-04-04 DIAGNOSIS — R05 Cough: Secondary | ICD-10-CM

## 2019-04-04 MED ORDER — TOBRAMYCIN 0.3 % OP SOLN: 1.0000 [drp] | OPHTHALMIC | 0 refills | Status: AC

## 2019-04-04 NOTE — ED Triage Notes (Signed)
RT eye watering and painful x 4 days

## 2019-04-04 NOTE — Discharge Instructions (Signed)
Your covid test is pending.  Try taking a different allergy medicaiton

## 2019-04-04 NOTE — ED Provider Notes (Signed)
[facility]    CSN: [id number] Arrival date & time: 04/04/19  1202      History   Chief Complaint Chief Complaint  Patient presents with  . Eye Problem    HPI Regina Rivas is a 53 y.o. female.   The history is provided by the patient. No language interpreter was used.  Eye Problem Location:  Right eye Quality:  Aching Severity:  Moderate Onset quality:  Gradual Duration:  3 days Timing:  Constant Progression:  Worsening Chronicity:  New Context: not chemical exposure, not contact lens problem and not foreign body   Relieved by:  Nothing Worsened by:  Nothing Ineffective treatments:  None tried Associated symptoms: no decreased vision   Risk factors: no conjunctival hemorrhage     Past Medical History:  Diagnosis Date  . Asthma     Patient Active Problem List   Diagnosis Date Noted  . First degree ankle sprain, left, initial encounter 09/22/2017    Past Surgical History:  Procedure Laterality Date  . APPENDECTOMY      OB History   No obstetric history on file.      Home Medications    Prior to Admission medications   Medication Sig Start Date End Date Taking? Authorizing Provider  fexofenadine (ALLEGRA) 180 MG tablet Take 180 mg by mouth daily.   Yes [provider]  ibuprofen (ADVIL,MOTRIN) 200 MG tablet Take three tablets ( 600 milligrams total) every 8 hrs with food as needed for pain. 09/12/17   Lajean Manes, MD  tobramycin (TOBREX) 0.3 % ophthalmic solution Place 1 drop into the left eye every 4 (four) hours for 10 days. 04/04/19 04/14/19  Elson Areas, PA-C    Family History Family History  Adopted: Yes    Social History Social History   Tobacco Use  . Smoking status: Never Smoker  . Smokeless tobacco: Never Used  Substance Use Topics  . Alcohol use: No  . Drug use: No     Allergies   Patient has no known allergies.   Review of Systems Review of Systems  All other systems reviewed and are negative.     Physical Exam Triage Vital Signs ED Triage Vitals  Enc Vitals Group     BP 04/04/19 1304 117/90     Pulse Rate 04/04/19 1304 97     Resp --      Temp 04/04/19 1304 98.2 F (36.8 C)     Temp Source 04/04/19 1304 Oral     SpO2 04/04/19 1304 97 %     Weight 04/04/19 1306 (!) 330 lb (149.7 kg)     Height 04/04/19 1306 5\' 9"  (1.753 m)     Head Circumference --      Peak Flow --      Pain Score 04/04/19 1305 5     Pain Loc --      Pain Edu? --      Excl. in GC? --    No data found.  Updated Vital Signs BP 117/90 (BP Location: Right Arm)   Pulse 97   Temp 98.2 F (36.8 C) (Oral)   Ht 5\' 9"  (1.753 m)   Wt (!) 149.7 kg   SpO2 97%   BMI 48.73 kg/m   Visual Acuity Right Eye Distance:   Left Eye Distance:   Bilateral Distance:    Right Eye Near:   Left Eye Near:    Bilateral Near:     Physical Exam Vitals signs and nursing note  reviewed.  Constitutional:      Appearance: She is well-developed.  HENT:     Head: Normocephalic.  Eyes:     Extraocular Movements: Extraocular movements intact.     Conjunctiva/sclera: Conjunctivae normal.     Pupils: Pupils are equal, round, and reactive to light.     Comments: Injected right conjunctiva,  fluro no uptake, no foreign body.   Neck:     Musculoskeletal: Normal range of motion.  Cardiovascular:     Rate and Rhythm: Normal rate.  Pulmonary:     Effort: Pulmonary effort is normal.  Abdominal:     General: There is no distension.  Musculoskeletal: Normal range of motion.  Neurological:     General: No focal deficit present.     Mental Status: She is alert and oriented to person, place, and time.      UC Treatments / Results  Labs (all labs ordered are listed, but only abnormal results are displayed) Labs Reviewed  SARS-COV-2 RNA,(COVID-19) QUALITATIVE NAAT    EKG   Radiology No results found.  Procedures Procedures (including critical care time)  Medications Ordered in UC Medications - No data to  display  Initial Impression / Assessment and Plan / UC Course  I have reviewed the triage vital signs and the nursing notes.  Pertinent labs & imaging results that were available during [facility] care of the patient were reviewed by me and considered in [facility] medical decision making (see chart for details).     MDM  Pt given rx of tobrex and advised to try zyrtec. Final Clinical Impressions(s) / UC Diagnoses   Final diagnoses:  Acute bacterial conjunctivitis of right eye  Cough     Discharge Instructions     Your covid test is pending.  Try taking a different allergy medicaiton   ED Prescriptions    Medication Sig Dispense Auth. Provider   tobramycin (TOBREX) 0.3 % ophthalmic solution Place 1 drop into the left eye every 4 (four) hours for 10 days. 5 mL Fransico Meadow, [place]     PDMP not reviewed this encounter.   Fransico Meadow, [place] 04/04/19 1400

## 2019-04-07 LAB — SARS-COV-2 RNA,(COVID-19) QUALITATIVE NAAT: SARS CoV2 RNA: NOT DETECTED

## 2019-09-29 IMAGING — DX DG HAND COMPLETE 3+V*L*
3 series · 3 of 3 positions shown · non-contrast
Comparison: None.

CLINICAL DATA: Status post fall.  Left hand pain.

EXAM:
LEFT HAND - COMPLETE 3+ VIEW

[hand pa]
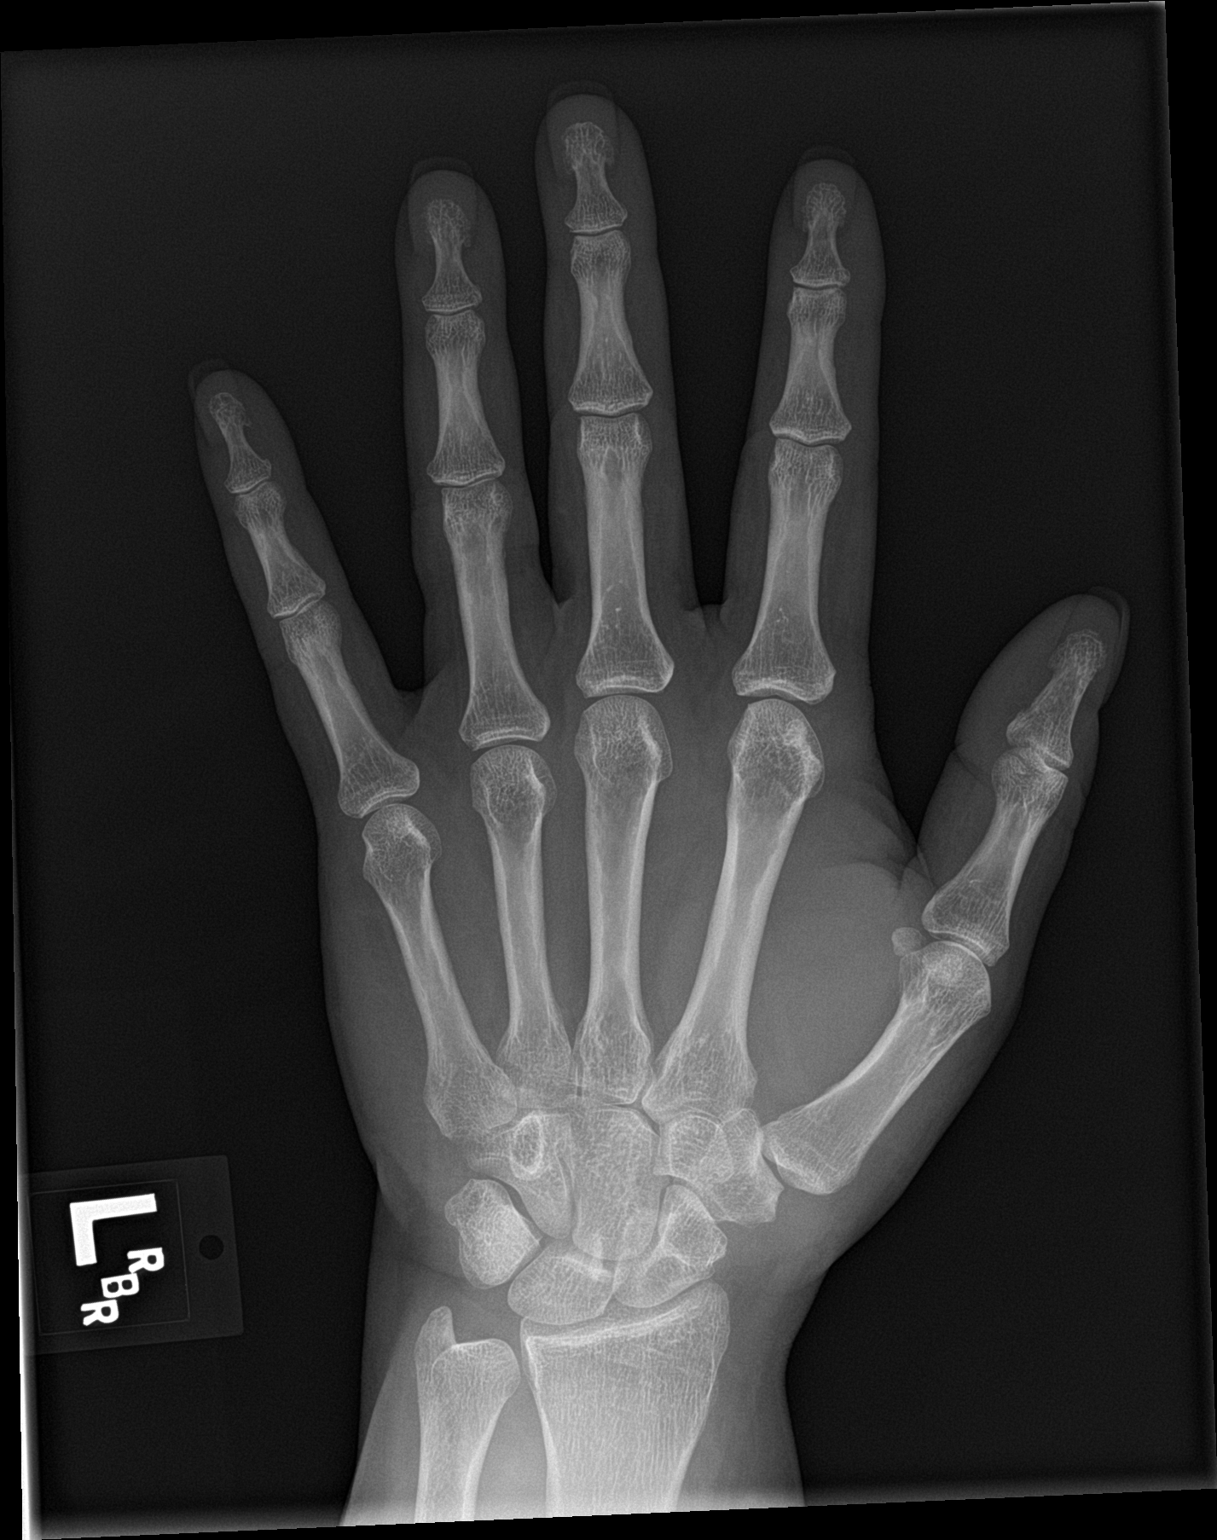

[hand obl]
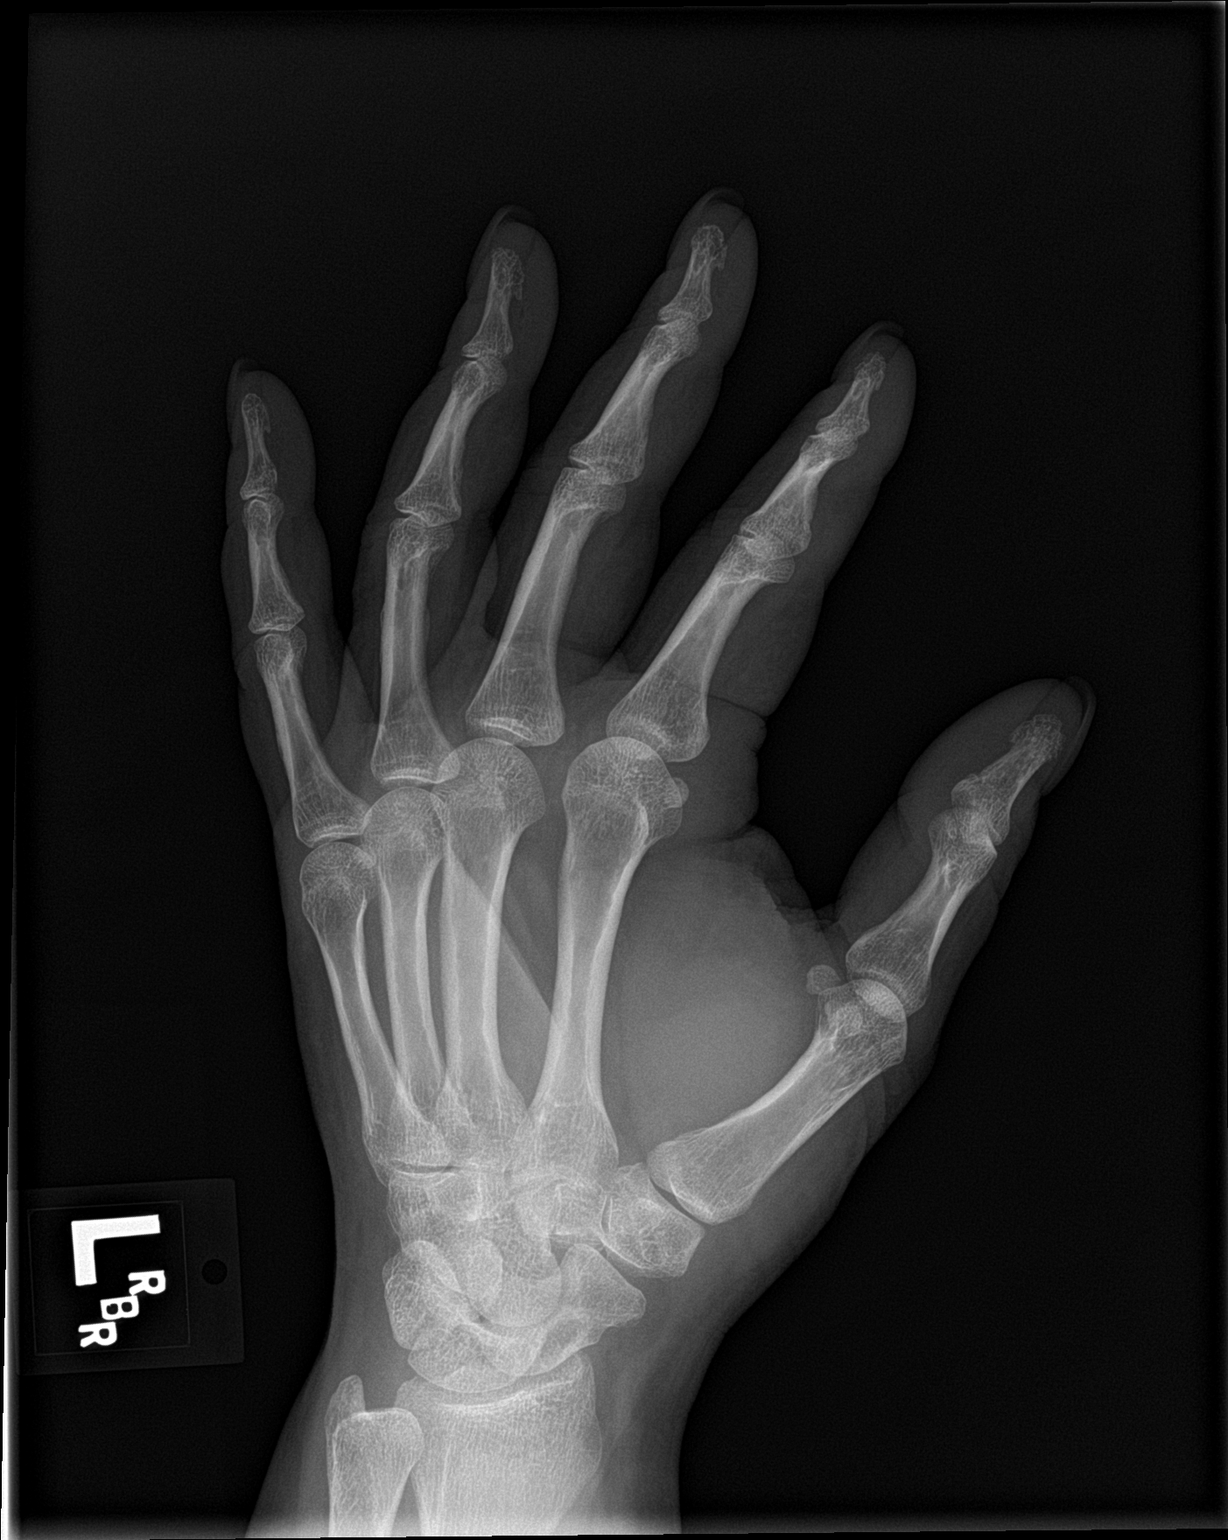

[hand lat]
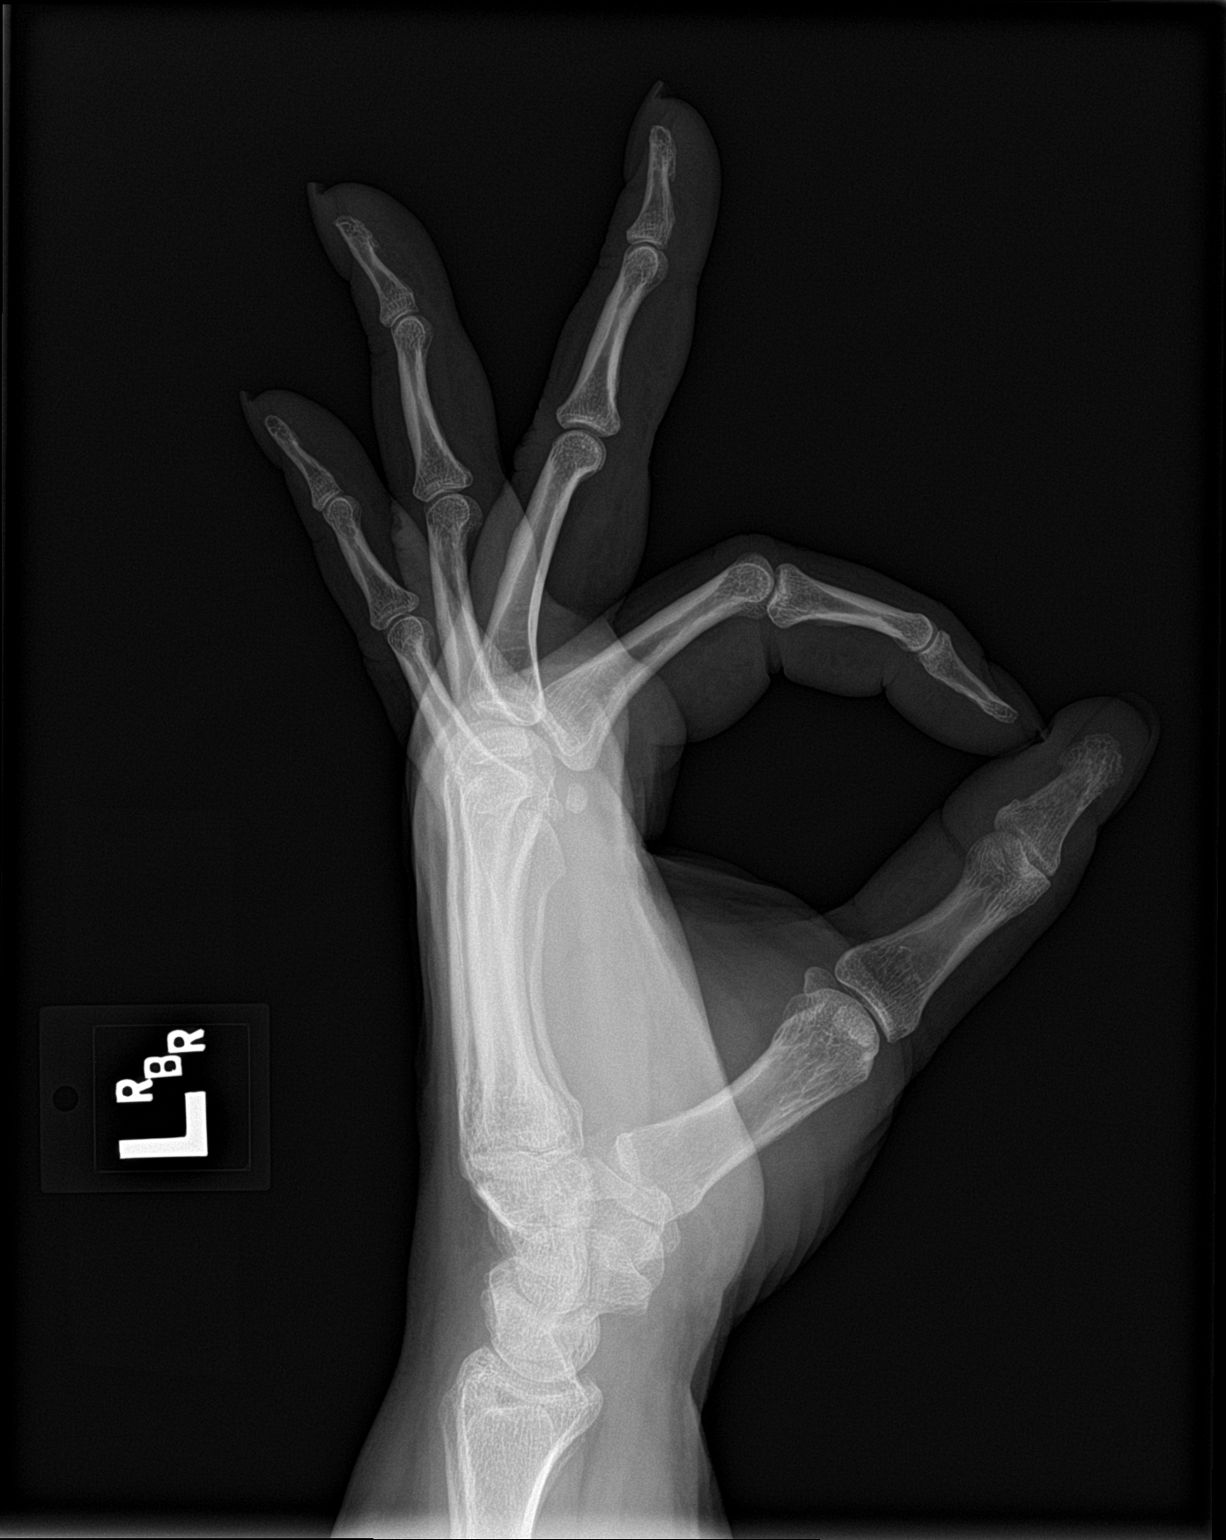

[3 of 3 positions shown; findings below may reference images not displayed]

FINDINGS: There is no evidence of fracture or dislocation. There is no
evidence of arthropathy or other focal bone abnormality. Soft
tissues are unremarkable.
IMPRESSION: No acute osseous injury of the left hand.

## 2019-09-29 IMAGING — DX DG ANKLE COMPLETE 3+V*L*
3 series · 3 of 3 positions shown · non-contrast
Comparison: None

CLINICAL DATA: Status post fall.  Twisting injury.

EXAM:
LEFT ANKLE COMPLETE - 3+ VIEW

[ankle ap]
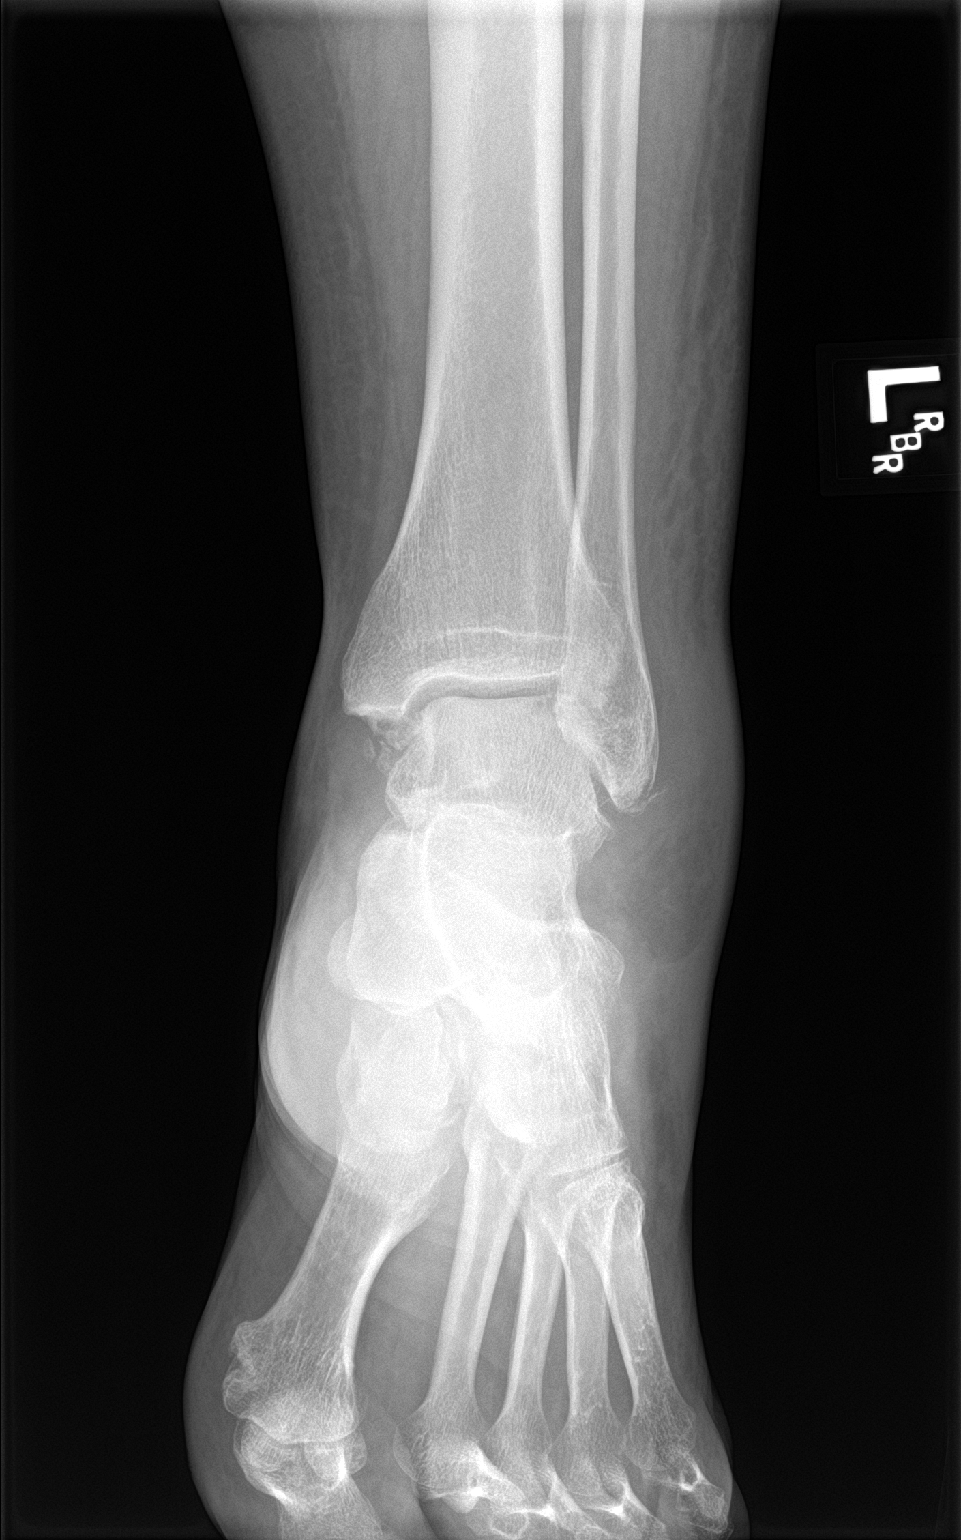

[ankle obl]
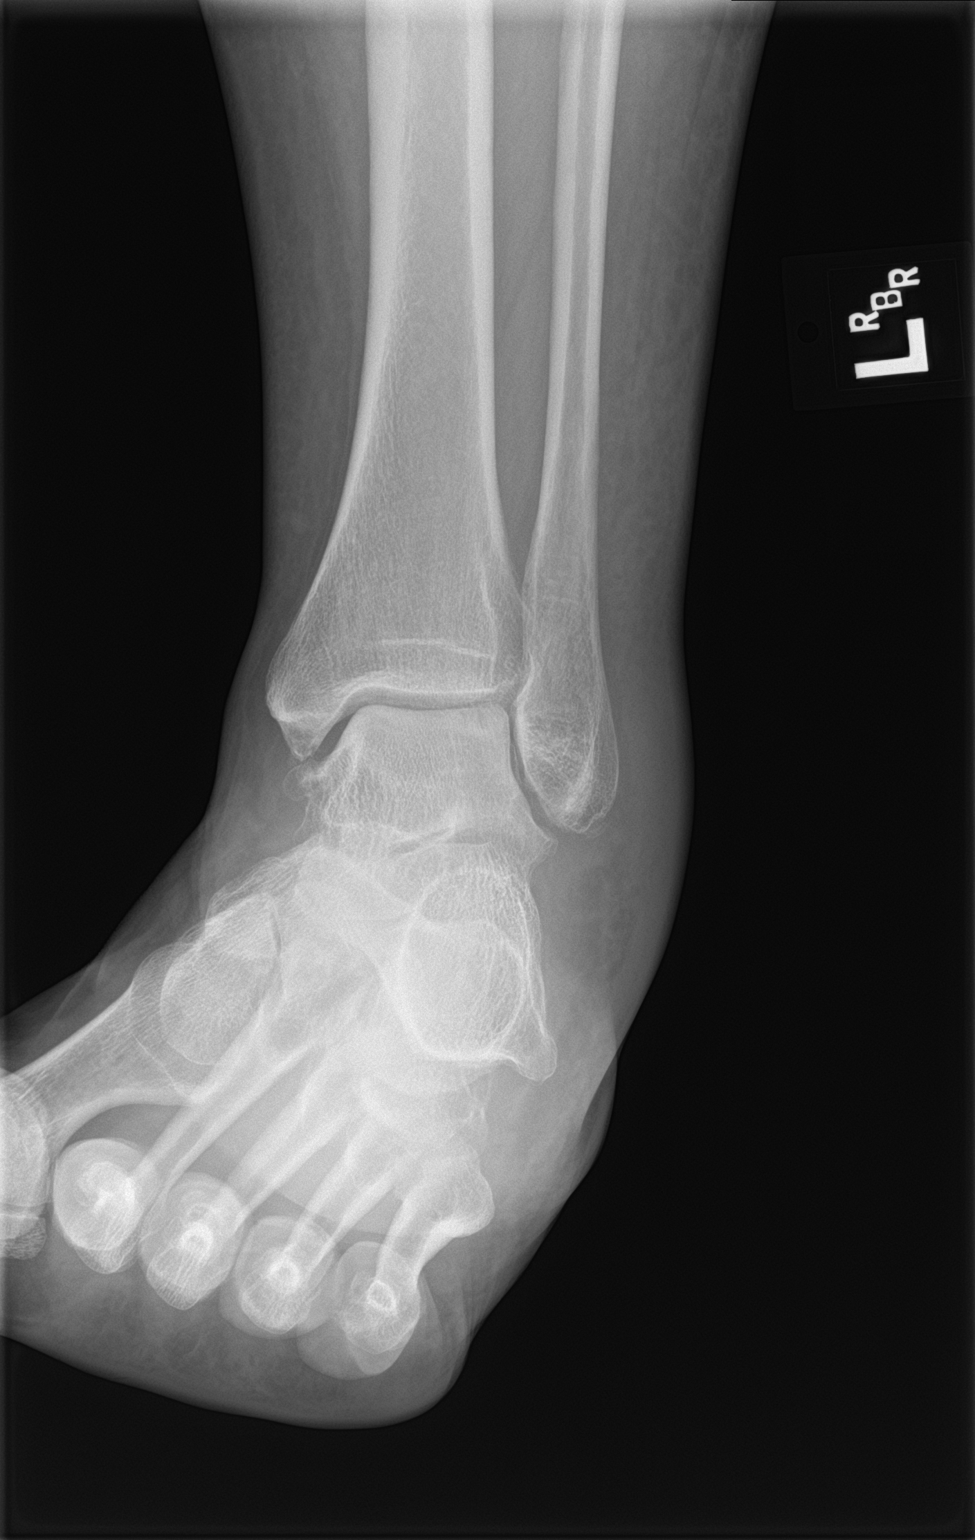

[ankle lat]
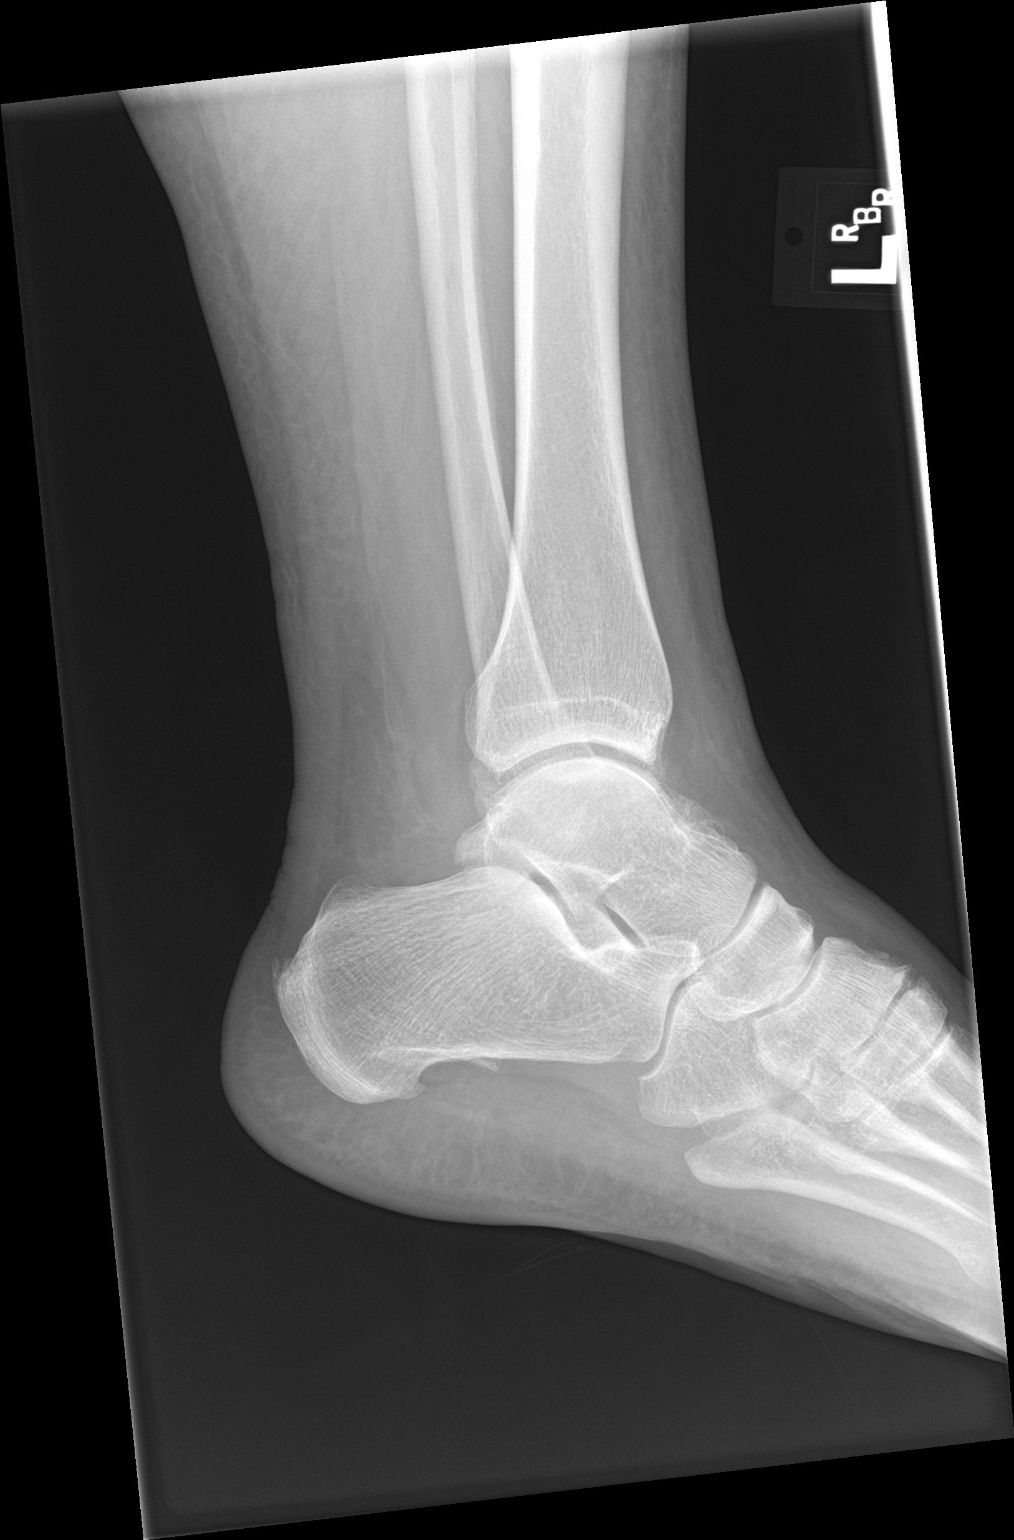

[3 of 3 positions shown; findings below may reference images not displayed]

FINDINGS: Avulsion fracture of distal tip of the lateral malleolus with severe
overlying soft tissue swelling.

Multiple well corticated bony fragments adjacent to the medial
malleolus consistent with prior avulsive injury. No other acute
fracture or dislocation. Ankle mortise is intact.
IMPRESSION: Avulsion fracture of distal tip of the lateral malleolus with severe
overlying soft tissue swelling.
